# Patient Record
Sex: Female | Born: 1975 | Hispanic: Yes | Marital: Married | Smoking: Never smoker
Health system: Southern US, Community
[De-identification: ages and names within clinical notes are randomized; demographics above are authoritative.]

---

## 2007-09-18 LAB — CONVERTED CEMR LAB: Pap Smear: NORMAL

## 2007-10-14 ENCOUNTER — Ambulatory Visit (HOSPITAL_COMMUNITY): Admission: RE | Admit: 2007-10-14 | Discharge: 2007-10-14 | Payer: Self-pay | Admitting: Obstetrics and Gynecology

## 2008-04-29 ENCOUNTER — Ambulatory Visit: Payer: Self-pay | Admitting: *Deleted

## 2008-04-29 DIAGNOSIS — J45909 Unspecified asthma, uncomplicated: Secondary | ICD-10-CM | POA: Insufficient documentation

## 2008-04-29 DIAGNOSIS — L259 Unspecified contact dermatitis, unspecified cause: Secondary | ICD-10-CM

## 2008-04-29 DIAGNOSIS — Z9189 Other specified personal risk factors, not elsewhere classified: Secondary | ICD-10-CM | POA: Insufficient documentation

## 2010-08-03 ENCOUNTER — Inpatient Hospital Stay (HOSPITAL_COMMUNITY)
Admission: AD | Admit: 2010-08-03 | Discharge: 2010-08-03 | Disposition: A | Discharge status: Home / Self Care | Payer: BLUE CROSS/BLUE SHIELD | Source: Ambulatory Visit | Attending: Obstetrics and Gynecology | Admitting: Obstetrics and Gynecology

## 2010-08-03 DIAGNOSIS — O99891 Other specified diseases and conditions complicating pregnancy: Secondary | ICD-10-CM | POA: Insufficient documentation

## 2010-08-03 DIAGNOSIS — R32 Unspecified urinary incontinence: Secondary | ICD-10-CM | POA: Insufficient documentation

## 2010-08-03 DIAGNOSIS — O9989 Other specified diseases and conditions complicating pregnancy, childbirth and the puerperium: Secondary | ICD-10-CM

## 2010-08-17 ENCOUNTER — Encounter (HOSPITAL_COMMUNITY)
Admission: RE | Admit: 2010-08-17 | Discharge: 2010-08-17 | Disposition: A | Discharge status: Home / Self Care | Payer: BLUE CROSS/BLUE SHIELD | Source: Ambulatory Visit | Attending: Obstetrics and Gynecology | Admitting: Obstetrics and Gynecology

## 2010-08-17 LAB — BASIC METABOLIC PANEL
CO2: 23 mEq/L (ref 19–32)
Chloride: 102 mEq/L (ref 96–112)
Creatinine, Ser: 0.64 mg/dL (ref 0.4–1.2)
GFR calc Af Amer: >60 mL/min (ref >60)
Potassium: 3.6 mEq/L (ref 3.5–5.1)
Sodium: 132 mEq/L — ABNORMAL LOW (ref 135–145)

## 2010-08-17 LAB — CBC
Hemoglobin: 12.7 g/dL (ref 12.0–15.0)
MCH: 28.9 pg (ref 26.0–34.0)
RBC: 4.39 MIL/uL (ref 3.87–5.11)

## 2010-08-17 LAB — SURGICAL PCR SCREEN: MRSA, PCR: NEGATIVE

## 2010-08-17 LAB — RPR: RPR Ser Ql: NONREACTIVE

## 2010-08-24 ENCOUNTER — Inpatient Hospital Stay (HOSPITAL_COMMUNITY)
Admission: RE | Admit: 2010-08-24 | Discharge: 2010-08-27 | DRG: 371 | Disposition: A | Discharge status: Home / Self Care | Payer: BLUE CROSS/BLUE SHIELD | Source: Ambulatory Visit | Attending: Obstetrics and Gynecology | Admitting: Obstetrics and Gynecology

## 2010-08-24 ENCOUNTER — Other Ambulatory Visit: Payer: Self-pay | Admitting: Obstetrics and Gynecology

## 2010-08-24 DIAGNOSIS — Z01818 Encounter for other preprocedural examination: Secondary | ICD-10-CM

## 2010-08-24 DIAGNOSIS — O3090 Multiple gestation, unspecified, unspecified trimester: Secondary | ICD-10-CM | POA: Diagnosis present

## 2010-08-24 DIAGNOSIS — Z01812 Encounter for preprocedural laboratory examination: Secondary | ICD-10-CM

## 2010-08-24 DIAGNOSIS — O34219 Maternal care for unspecified type scar from previous cesarean delivery: Principal | ICD-10-CM | POA: Diagnosis present

## 2010-08-24 DIAGNOSIS — O30009 Twin pregnancy, unspecified number of placenta and unspecified number of amniotic sacs, unspecified trimester: Secondary | ICD-10-CM | POA: Diagnosis present

## 2010-08-24 DIAGNOSIS — O99814 Abnormal glucose complicating childbirth: Secondary | ICD-10-CM | POA: Diagnosis present

## 2010-08-24 LAB — GLUCOSE, CAPILLARY
Glucose-Capillary: 69 mg/dL — ABNORMAL LOW (ref 70–99)
Glucose-Capillary: 84 mg/dL (ref 70–99)

## 2010-08-25 LAB — RPR: RPR Ser Ql: NONREACTIVE

## 2010-08-25 LAB — CBC
Platelets: 137 10*3/uL — ABNORMAL LOW (ref 150–400)
RDW: 14.7 % (ref 11.5–15.5)
WBC: 9.3 10*3/uL (ref 4.0–10.5)

## 2010-09-12 NOTE — Discharge Summary (Signed)
  NAME:  Donna Lewis, Donna Lewis              ACCOUNT NO.:  [id number]  MEDICAL RECORD NO.:  [id number]           PATIENT TYPE:  I  LOCATION:  9147                          FACILITY:  [facility]  PHYSICIAN:  Arlyce Harman, MD     DATE OF BIRTH:  [birth year]  DATE OF ADMISSION:  08/24/2010 DATE OF DISCHARGE:  08/27/2010                              DISCHARGE SUMMARY   HOSPITAL COURSE:  The patient was admitted on Aug 24, 2010 and underwent a low transverse cervical cesarean section and bilateral tubal ligation. Postoperative course has been uncomplicated and the patient is being discharged on the third postop day in good condition.  She will be followed up in my office in 2 weeks to check her incision.  The patient was admitted and had a C-section for twins.  PERTINENT LABORATORY DATA:  Hemoglobin on May 5 was 10.6.  FINAL DIAGNOSIS:  Status post C section for twins and elective sterilization.  SIGNIFICANT FINDINGS:  None.  PROCEDURES PERFORMED:  Secondary low transverse cervical cesarean section and Pomeroy tubal ligation.  CONDITION AT DISCHARGE:  Excellent condition.  Vital signs are stable.  The patient is ambulatory.  Instructions given to the patient.  Follow up in the office in 2 weeks.  No heavy lifting, home confinement for the next 2 weeks, then gradually increase activities, no coitus for 6 weeks.  Do not lift over 10 pounds for the first 2-3 weeks.  MEDICATIONS: 1. Percocet 5/325.  She is to take 1-2 every 4-6 hours as needed for     pain. 2. Ferrous sulfate 325 mg b.i.d. 3. Laxatives and stool softeners if needed.     Arlyce Harman, MD     EG/MEDQ  D:  08/27/2010  T:  08/28/2010  Job:  [id number]  Electronically Signed by Arlyce Harman MD on 09/12/2010 06:29:31 PM

## 2010-10-09 ENCOUNTER — Other Ambulatory Visit (HOSPITAL_COMMUNITY)
Admission: RE | Admit: 2010-10-09 | Discharge: 2010-10-09 | Disposition: A | Discharge status: Home / Self Care | Payer: BC Managed Care – PPO | Source: Ambulatory Visit | Attending: Obstetrics and Gynecology | Admitting: Obstetrics and Gynecology

## 2010-10-09 ENCOUNTER — Other Ambulatory Visit: Payer: Self-pay | Admitting: Obstetrics and Gynecology

## 2010-10-09 DIAGNOSIS — Z01419 Encounter for gynecological examination (general) (routine) without abnormal findings: Secondary | ICD-10-CM | POA: Insufficient documentation

## 2010-10-15 NOTE — Op Note (Signed)
NAME:  Donna Lewis, Donna Lewis              ACCOUNT NO.:  [id number]  MEDICAL RECORD NO.:  [id number]           PATIENT TYPE:  I  LOCATION:  9147                          [facility]ILITY:  [facility]  PHYSICIAN:  Arlyce Harman, MD     DATE OF BIRTH:  [birth year]  DATE OF PROCEDURE:  08/24/2010 DATE OF DISCHARGE:  Procedure:  Secondary low transverse Cesarean sectoin, pomeroy tubal ligation                              OPERATIVE REPORT   PREOPERATIVE DIAGNOSES: 1. A 38+ weeks twins. 2. Previous cesarean section for repeat cesarean section elective     sterilization. 3. Gestational diabetes, diet-controlled.  POSTOPERATIVE DIAGNOSES: 1. A 38+ weeks twins. 2. Previous cesarean section for repeat cesarean section elective     sterilization. 3. Gestational diabetes, diet-controlled.  SURGEON:  Arlyce Harman, MD  ASSISTANT:  Pieter Partridge, MD  INDICATIONS AND CONSENT:  The patient is a 35 year old gravida 2, para 1 who is 38+ weeks with twins and had a previous cesarean section for repeat C-section, twins are breech, transverse.  The patient has gestational diabetes and is diet control and the patient has the crusted elective sterilization.  Risks and possible complications have been discussed and consent has been given.  PROCEDURE:  The patient was placed on the table in a supine position with wedging.  The abdomen and vagina were sterilely prepped and draped in the usual fashion and a Pfannenstiel incision was made along the margins of the previous incision and the scar was excised and handed off.  The incision was extended through the abdominal layers and the peritoneum was entered.  The uterus was entered in a low transverse cervical fashion and the membranes presented, they were brought forth and ruptured.  The fluid was clear.  Baby A was delivered by grasping the 2 feet which presented and then delivered the lower extremities followed by the lower torso and upper extremities and then the  head was delivered using a Mauriceau technique and the cord was clamped and cut and the nose was suctioned out and one clamp was placed on the cord of baby A, which was female.  Her weight was later found to be 6 pounds and 4 ounces, Apgars 9 at 9, then the second bag presented and the membranes were clear.  The membranes were ruptured and the shoulder and upper extremity presented, which was the replaced and then was manipulated around, so we could grasp the lower extremities and then they were delivered followed by lower and upper torso and upper extremities and the head was delivered using a Mauriceau technique and the cord was doubly clamped and cut and the mouth was suctioned out, and this infant was a female with Apgars of 8 and 9, weight was 6 pounds and 15 ounces. The placenta was then spontaneously delivered and this was handed off and the uterus was sponged out.  The uterus was closed with 0 Vicryl in a running locked fashion and the second layer being that of a modified Lembert stitch.  The peritoneal portion was closed with 2-0 chromic suture in a running fashion.  The abdomen was then irrigated and suctioned out.  Hemostasis was good.  At this point, the peritoneum was closed using 2-0 chromic in a running fashion and the muscle was approximated using 2-0 chromic to the midline, two interrupted sutures were placed.  The field was then irrigated and suctioned again and the fascia was closed using 0 Vicryl in a running locked fashion.  The subcu was then closed using 2-0 plain in a running fashion and the skin was approximated with 4-0 Vicryl using a Keith needle and closed with interrupted subcuticular fashion.  Pressure dressing was then placed on the incision and the patient was not really awake.  She was taken to recovery in good condition.  SPECIMENS:  Bilateral tubal segments and previous scar which was removed and the placenta.  DISPOSITION:  Sent to  pathology.  ESTIMATED BLOOD LOSS:  800 mL.     Arlyce Harman, MD     EG/MEDQ  D:  08/24/2010  T:  08/25/2010  Job:  [id number]  Electronically Signed by Arlyce Harman MD on 10/15/2010 10:20:00 AM

## 2012-02-03 ENCOUNTER — Other Ambulatory Visit (HOSPITAL_COMMUNITY)
Admission: RE | Admit: 2012-02-03 | Discharge: 2012-02-03 | Disposition: A | Discharge status: Home / Self Care | Payer: BC Managed Care – PPO | Source: Ambulatory Visit | Attending: Obstetrics and Gynecology | Admitting: Obstetrics and Gynecology

## 2012-02-03 ENCOUNTER — Other Ambulatory Visit: Payer: Self-pay | Admitting: Obstetrics and Gynecology

## 2012-02-03 DIAGNOSIS — Z01419 Encounter for gynecological examination (general) (routine) without abnormal findings: Secondary | ICD-10-CM | POA: Insufficient documentation

## 2012-02-03 DIAGNOSIS — Z1151 Encounter for screening for human papillomavirus (HPV): Secondary | ICD-10-CM | POA: Insufficient documentation

## 2021-05-24 ENCOUNTER — Encounter: Payer: Self-pay | Admitting: Family Medicine

## 2021-05-24 ENCOUNTER — Telehealth: Payer: Self-pay | Admitting: Family Medicine

## 2021-05-24 ENCOUNTER — Ambulatory Visit: Payer: 59 | Admitting: Family Medicine

## 2021-05-24 VITALS — BP 123/93 | HR 97 | Ht 65.0 in | Wt 170.6 lb

## 2021-05-24 DIAGNOSIS — Z7689 Persons encountering health services in other specified circumstances: Secondary | ICD-10-CM

## 2021-05-24 DIAGNOSIS — Z0000 Encounter for general adult medical examination without abnormal findings: Secondary | ICD-10-CM

## 2021-05-24 DIAGNOSIS — Z803 Family history of malignant neoplasm of breast: Secondary | ICD-10-CM | POA: Diagnosis not present

## 2021-05-24 DIAGNOSIS — Z1231 Encounter for screening mammogram for malignant neoplasm of breast: Secondary | ICD-10-CM | POA: Diagnosis not present

## 2021-05-24 LAB — CBC
HCT: 42 % (ref 36.0–46.0)
Hemoglobin: 13.8 g/dL (ref 12.0–15.0)
MCHC: 32.8 g/dL (ref 30.0–36.0)
MCV: 89.8 fl (ref 78.0–100.0)
Platelets: 416 10*3/uL — ABNORMAL HIGH (ref 150.0–400.0)
RBC: 4.67 Mil/uL (ref 3.87–5.11)
RDW: 12.4 % (ref 11.5–15.5)
WBC: 6.3 10*3/uL (ref 4.0–10.5)

## 2021-05-24 LAB — COMPREHENSIVE METABOLIC PANEL
ALT: 21 U/L (ref 0–35)
AST: 17 U/L (ref 0–37)
Albumin: 4.4 g/dL (ref 3.5–5.2)
Alkaline Phosphatase: 81 U/L (ref 39–117)
BUN: 10 mg/dL (ref 6–23)
CO2: 32 mEq/L (ref 19–32)
Calcium: 9.3 mg/dL (ref 8.4–10.5)
Chloride: 100 mEq/L (ref 96–112)
Creatinine, Ser: 0.68 mg/dL (ref 0.40–1.20)
GFR: 105.27 mL/min (ref >60.00)
Glucose, Bld: 97 mg/dL (ref 70–99)
Potassium: 3.9 mEq/L (ref 3.5–5.1)
Sodium: 138 mEq/L (ref 135–145)
Total Bilirubin: 0.4 mg/dL (ref 0.2–1.2)
Total Protein: 7.7 g/dL (ref 6.0–8.3)

## 2021-05-24 LAB — LIPID PANEL
Cholesterol: 144 mg/dL (ref 0–200)
HDL: 38.9 mg/dL — ABNORMAL LOW (ref >39.00)
NonHDL: 104.93
Total CHOL/HDL Ratio: 4
Triglycerides: 269 mg/dL — ABNORMAL HIGH (ref 0.0–149.0)
VLDL: 53.8 mg/dL — ABNORMAL HIGH (ref 0.0–40.0)

## 2021-05-24 LAB — LDL CHOLESTEROL, DIRECT: Direct LDL: 58 mg/dL

## 2021-05-24 LAB — TSH: TSH: 3.38 u[IU]/mL (ref 0.35–5.50)

## 2021-05-24 NOTE — Telephone Encounter (Signed)
Pt wanted taylor to know her previous gyno was Dr. Neva Seat. She will be trying to upload medical records. Please advise  [facility] [address] [place] [address]

## 2021-05-24 NOTE — Progress Notes (Signed)
BP (!) 123/93    Pulse 97    Ht 5\' 5"  (1.651 m)    Wt 170 lb 9.6 oz (77.4 kg)    BMI 28.39 kg/m    Subjective:    Patient ID: Donna Lewis, female    DOB: 24-Oct-1975, 46 y.o.   MRN: [id number]  HPI: Donna Lewis is a 46 y.o. female presenting on 05/24/2021 for comprehensive medical examination. Current medical complaints include: none  She currently lives with: husband and children (3) Interim Problems from her last visit: no   She reports regular vision exams q1-5y: yes She reports regular dental exams q 7515m: yes Her diet consists of: well balanced  She endorses exercise and/or activity of: walking daily  She works at: sign language interpreter   She denies ETOH use. She denies nictoine use. She denies illegal substance use.    She reports regular menstrual periods with heavy flow. Current menopausal symptoms: no She is currently  sexually active  She denies  concerns today about STI Contraception choices are: tubal ligation   She denies concerns about skin changes today. She denies concerns about bowel changes today. She denies concerns about bladder changes today.   Depression Screen done today and results listed below:  [facility]  Decreased Interest 0  Down, Depressed, Hopeless 0  PHQ - 2 Score 0     She does not have a history of falls.     Past Medical History:  History reviewed. No pertinent past medical history.  Surgical History:  Past Surgical History:  Procedure Laterality Date   CESAREAN SECTION      Medications:  No current outpatient medications on file prior to visit.   No current facility-administered medications on file prior to visit.    Allergies:  No Known Allergies  Social History:  Social History   Socioeconomic History   Marital status: Married    Spouse name: Not on file   Number of children: Not on file   Years of education: Not on file   Highest education level: Not on file  Occupational  History   Not on file  Tobacco Use   Smoking status: Never   Smokeless tobacco: Never  Substance and Sexual Activity   Alcohol use: Not Currently   Drug use: Never   Sexual activity: Yes  Other Topics Concern   Not on file  Social History Narrative   Not on file   Social Determinants of Health   Financial Resource Strain: Not on file  Food Insecurity: Not on file  Transportation Needs: Not on file  Physical Activity: Not on file  Stress: Not on file  Social Connections: Not on file  Intimate Partner Violence: Not on file   Social History   Tobacco Use  Smoking Status Never  Smokeless Tobacco Never   Social History   Substance and Sexual Activity  Alcohol Use Not Currently    Family History:  Family History  Problem Relation Age of Onset   Hypertension Father    Hyperlipidemia Father    Thyroid disease Father     Past medical history, surgical history, medications, allergies, family history and social history reviewed with patient today and changes made to appropriate areas of the chart.   All ROS negative except what is listed above and in the HPI.      Objective:    BP (!) 123/93    Pulse 97    Ht 5\' 5"  (1.651 m)  Wt 170 lb 9.6 oz (77.4 kg)    BMI 28.39 kg/m   Wt Readings from Last 3 Encounters:  05/24/21 170 lb 9.6 oz (77.4 kg)    Physical Exam Vitals and nursing note reviewed.  Constitutional:      General: She is not in acute distress.    Appearance: Normal appearance. She is normal weight.  HENT:     Head: Normocephalic and atraumatic.     Right Ear: Hearing, tympanic membrane, ear canal and external ear normal.     Left Ear: Hearing, tympanic membrane, ear canal and external ear normal.     Nose: Nose normal. No mucosal edema, congestion or rhinorrhea.     Right Sinus: No maxillary sinus tenderness or frontal sinus tenderness.     Left Sinus: No maxillary sinus tenderness or frontal sinus tenderness.     Mouth/Throat:     Lips: Pink.      Mouth: Mucous membranes are moist.     Tongue: No lesions.     Pharynx: Oropharynx is clear. Uvula midline. No oropharyngeal exudate or posterior oropharyngeal erythema.     Tonsils: No tonsillar exudate or tonsillar abscesses.  Eyes:     General: Lids are normal. Vision grossly intact.     Extraocular Movements: Extraocular movements intact.     Conjunctiva/sclera: Conjunctivae normal.     Pupils: Pupils are equal, round, and reactive to light.     Funduscopic exam:    Right eye: Red reflex present.        Left eye: Red reflex present.    Visual Fields: Right eye visual fields normal and left eye visual fields normal.  Neck:     Thyroid: No thyroid mass, thyromegaly or thyroid tenderness.     Vascular: No carotid bruit or JVD.     Trachea: Trachea normal.  Cardiovascular:     Rate and Rhythm: Normal rate and regular rhythm.     Chest Wall: PMI is not displaced.     Pulses: Normal pulses.     Heart sounds: Normal heart sounds. No murmur heard. Pulmonary:     Effort: Pulmonary effort is normal. No respiratory distress.     Breath sounds: Normal breath sounds.  Chest:     Chest wall: No deformity.  Breasts:    Breasts are symmetrical.  Abdominal:     General: Abdomen is flat. Bowel sounds are normal. There is no distension.     Palpations: Abdomen is soft. There is no hepatomegaly, splenomegaly or mass.     Tenderness: There is no abdominal tenderness. There is no right CVA tenderness, left CVA tenderness, guarding or rebound.  Genitourinary:    Vagina: Normal. No tenderness or lesions.     Cervix: No cervical motion tenderness, discharge, friability or lesion.     Uterus: Normal. Not tender.      Adnexa: Right adnexa normal and left adnexa normal.       Right: No mass or tenderness.         Left: No mass or tenderness.    Musculoskeletal:        General: No swelling, tenderness, deformity or signs of injury. Normal range of motion.     Right shoulder: Normal.     Left  shoulder: Normal.     Right upper arm: Normal.     Left upper arm: Normal.     Cervical back: Normal, full passive range of motion without pain, normal range of motion and neck supple. No tenderness.  Thoracic back: Normal.     Lumbar back: Normal.     Right hip: Normal.     Left hip: Normal.     Right upper leg: Normal.     Left upper leg: Normal.     Right knee: Normal.     Left knee: Normal.     Right lower leg: No edema.     Left lower leg: No edema.  Feet:     Right foot:     Skin integrity: Skin integrity normal.     Toenail Condition: Right toenails are normal.     Left foot:     Skin integrity: Skin integrity normal.     Toenail Condition: Left toenails are normal.  Lymphadenopathy:     Head:     Right side of head: No submental or tonsillar adenopathy.     Left side of head: No submental or tonsillar adenopathy.     Cervical: No cervical adenopathy.     Right cervical: No superficial cervical adenopathy.    Left cervical: No superficial cervical adenopathy.     Upper Body:     Right upper body: No supraclavicular adenopathy.     Left upper body: No supraclavicular adenopathy.  Skin:    General: Skin is warm and dry.     Capillary Refill: Capillary refill takes less than 2 seconds.     Findings: No bruising or rash.     Nails: There is no clubbing.  Neurological:     General: No focal deficit present.     Mental Status: She is alert and oriented to person, place, and time.     GCS: GCS eye subscore is 4. GCS verbal subscore is 5. GCS motor subscore is 6.     Sensory: Sensation is intact.     Motor: Motor function is intact.     Coordination: Coordination is intact.     Gait: Gait is intact.     Deep Tendon Reflexes: Reflexes are normal and symmetric.  Psychiatric:        Attention and Perception: Attention normal.        Mood and Affect: Mood and affect normal.        Speech: Speech normal.        Behavior: Behavior normal. Behavior is cooperative.         Thought Content: Thought content normal.        Cognition and Memory: Cognition and memory normal.        Judgment: Judgment normal.    Results for orders placed or performed during the hospital encounter of 08/24/10  Glucose, capillary  Result Value Ref Range   Glucose-Capillary 84 70 - 99 mg/dL  Glucose, capillary  Result Value Ref Range   Glucose-Capillary 69 (L) 70 - 99 mg/dL  CBC  Result Value Ref Range   WBC 9.3 4.0 - 10.5 K/uL   RBC 3.74 (L) 3.87 - 5.11 MIL/uL   Hemoglobin 10.6 (L) 12.0 - 15.0 g/dL   HCT 62.8 (L) 36.6 - 29.4 %   MCV 88.5 78.0 - 100.0 fL   MCH 28.3 26.0 - 34.0 pg   MCHC 32.0 30.0 - 36.0 g/dL   RDW 76.5 46.5 - 03.5 %   Platelets 137 (L) 150 - 400 K/uL  RPR  Result Value Ref Range   RPR Ser Ql NON REACTIVE NON REACTIVE      Assessment & Plan:   Problem List Items Addressed This Visit   None Visit Diagnoses  Family history of breast cancer    -  Primary   Relevant Orders   MM DIGITAL SCREENING BILATERAL   Encounter for screening mammogram for malignant neoplasm of breast       Relevant Orders   MM DIGITAL SCREENING BILATERAL   Encounter to establish care       Annual physical exam       Relevant Orders   CBC   Comprehensive metabolic panel   Lipid panel   TSH          LABORATORY TESTING:  - Health maintenance labs ordered today as discussed above.           CBC, CMP, LIPIDS          TSH  - STI testing: deferred - Pap smear: declined - HIV screening: done elsewhere - requesting records  - Hep C screening: done elsewhere - requesting records    IMMUNIZATIONS:   - Tdap: Tetanus vaccination status reviewed: declined. - Influenza: Refused - Pneumovax: Not applicable - Prevnar: Not applicable - HPV: Not applicable - Shingrix vaccine: Not applicable - [id number]: Refused  SCREENING: - Mammogram: Ordered today - Bone Density: Not applicable - Colonoscopy: Not applicable  - AAA Screening: Not applicable  -Hearing Test: Not  applicable  -Spirometry: Not applicable  - Lung Cancer Screening: Not applicable    PATIENT COUNSELING:   Advised to take 1 mg of folate supplement per day if capable of pregnancy.   Sexuality: Discussed sexually transmitted diseases, partner selection, use of condoms, avoidance of unintended pregnancy, and contraceptive alternatives.    I discussed with the patient that most people either abstain from alcohol or drink within safe limits (<=14/week and <=4 drinks/occasion for males, <=7/weeks and <= 3 drinks/occasion for females) and that the risk for alcohol disorders and other health effects rises proportionally with the number of drinks per week and how often a drinker exceeds daily limits.  Discussed cessation/primary prevention of drug use and availability of treatment for abuse.   Diet: Encouraged to adjust caloric intake to maintain or achieve ideal body weight, to reduce intake of dietary saturated fat and total fat, to limit sodium intake by avoiding high sodium foods and not adding table salt, and to maintain adequate dietary potassium and calcium preferably from fresh fruits, vegetables, and low-fat dairy products. Encouraged vitamin D 1000 units and Calcium 1300mg  or 4 servings of dairy a day.  Emphasized the importance of regular exercise.  Injury prevention: Discussed safety belts, safety helmets, smoke detector, smoking near bedding or upholstery.   Dental health: Discussed importance of regular tooth brushing, flossing, and dental visits.  Follow up plan:  Return in about 1 year (around 05/24/2022) for physical, needs pap.   Purcell Nails Olevia Bowens, DNP, FNP-C

## 2021-05-24 NOTE — Patient Instructions (Signed)
Thank you for choosing [facility] at [facility] for your Primary Care needs. I am excited for the opportunity to partner with you to meet your health care goals. It was a pleasure meeting you today! ° ° °Information on diet, exercise, and health maintenance recommendations are listed below. This is information to help you be sure you are on track for optimal health and monitoring.  ° °Please look over this and let us know if you have any questions or if you have completed any of the health maintenance outside of [facility] so that we can be sure your records are up to date.  °___________________________________________________________ ° °MyChart:  °For all urgent or time sensitive needs we ask that you please call the office to avoid delays. Our number is [phone]. °MyChart is not constantly monitored and due to the large volume of messages a day, replies may take up to 72 business hours. ° °MyChart Policy: °MyChart allows for you to see your visit notes, after visit summary, provider recommendations, lab and tests results, make an appointment, request refills, and contact your provider or the office for non-urgent questions or concerns. Providers are seeing patients during normal business hours and do not have built in time to review MyChart messages.  °We ask that you allow a minimum of 3 business days for responses to MyChart messages. For this reason, please do not send urgent requests through MyChart. Please call the office at [phone]. °New and ongoing conditions may require a visit. We have virtual and in-person visits available for your convenience.  °Complex MyChart concerns may require a visit. Your provider may request you schedule a virtual or in-person visit to ensure we are providing the best care possible. °MyChart messages sent after 11:00 AM on Friday will not be received by the provider until Monday morning.  °  °Lab and Test Results: °You will receive your lab and  test results on MyChart as soon as they are completed and results have been sent by the lab or testing facility. Due to this service, you will receive your results BEFORE your provider.  °I review lab and test results each morning prior to seeing patients. Some results require collaboration with other providers to ensure you are receiving the most appropriate care. For this reason, we ask that you please allow a minimum of 3-5 business days from the time that ALL results have been received for your provider to receive and review lab and test results and contact you about these.  °Most lab and test result comments from the provider will be sent through MyChart. Your provider may recommend changes to the plan of care, follow-up visits, repeat testing, ask questions, or request an office visit to discuss these results. You may reply directly to this message or call the office to provide information for the provider or set up an appointment. °In some instances, you will be called with test results and recommendations. Please let us know if this is preferred and we will make note of this in your chart to provide this for you.    °If you have not heard a response to your lab or test results in 5 business days from all results returning to MyChart, please call the office to let us know. We ask that you please avoid calling prior to this time unless there is an emergent concern. Due to high call volumes, this can delay the resulting process. ° °After Hours: °For all non-emergency after hours needs,   please call the office at [phone] and select the option to reach the on-call  service. On-call services are shared between multiple [facility] offices and therefore it will not be possible to speak directly with your provider. On-call providers may provide medical advice and recommendations, but are unable to provide refills for maintenance medications.  °For all emergency or urgent medical needs after normal business  hours, we recommend that you seek care at the closest Urgent Care or Emergency Department to ensure appropriate treatment in a timely manner.  °[facility] has a 24 hour emergency room located on the ground floor for your convenience.  ° °Urgent Concerns During the Business Day °Providers are seeing patients from 8AM to 5PM with a busy schedule and are most often not able to respond to non-urgent calls until the end of the day or the next business day. °If you should have URGENT concerns during the day, please call and speak to the nurse or schedule a same day appointment so that we can address your concern without delay.  ° °Thank you, again, for choosing me as your health care partner. I appreciate your trust and look forward to learning more about you.  ° °Krystina Strieter B. Qamar Aughenbaugh, DNP, FNP-C ° °___________________________________________________________ ° °Health Maintenance Recommendations °Screening Testing °Mammogram °Every 1-2 years based on history and risk factors °Starting at age 50 °Pap Smear °Ages 21-39 every 3 years °Ages 30-65 every 5 years with HPV testing °More frequent testing may be required based on results and history °Colon Cancer Screening °Every 1-10 years based on test performed, risk factors, and history °Starting at age 45 °Bone Density Screening °Every 2-10 years based on history °Starting at age 65 for women °Recommendations for men differ based on medication usage, history, and risk factors °AAA Screening °One time ultrasound °Men 65-75 years old who have ever smoked °Lung Cancer Screening °Low Dose Lung CT every 12 months °Age 50-80 years with a 20 pack-year smoking history who still smoke or who have quit within the last 15 years ° °Screening Labs °Routine  Labs: Complete Blood Count (CBC), Complete Metabolic Panel (CMP), Cholesterol (Lipid Panel) °Every 6-12 months based on history and medications °May be recommended more frequently based on current conditions or  previous results °Hemoglobin A1c Lab °Every 3-12 months based on history and previous results °Starting at age 45 or earlier with diagnosis of diabetes, high cholesterol, BMI >26, and/or risk factors °Frequent monitoring for patients with diabetes to ensure blood sugar control °Thyroid Panel (TSH w/ T3 & T4) °Every 6 months based on history, symptoms, and risk factors °May be repeated more often if on medication °HIV °One time testing for all patients 13 and older °May be repeated more frequently for patients with increased risk factors or exposure °Hepatitis C °One time testing for all patients 18 and older °May be repeated more frequently for patients with increased risk factors or exposure °Gonorrhea, Chlamydia °Every 12 months for all sexually active persons 13-24 years °Additional monitoring may be recommended for those who are considered high risk or who have symptoms °PSA °Men 40-54 years old with risk factors °Additional screening may be recommended from age 55-69 based on risk factors, symptoms, and history ° °Vaccine Recommendations °Tetanus Booster °All adults every 10 years °Flu Vaccine °All patients 6 months and older every year °COVID Vaccine °All patients 12 years and older °Initial dosing with booster °May recommend additional booster based on age and health history °HPV Vaccine °2 doses all patients   age 9-26 °Dosing may be considered for patients over 26 °Shingles Vaccine (Shingrix) °2 doses all adults 50 years and older °Pneumonia (Pneumovax 23) °All adults 65 years and older °May recommend earlier dosing based on health history °Pneumonia (Prevnar 13) °All adults 65 years and older °Dosed 1 year after Pneumovax 23 °Pneumonia (Prevnar 20) °All adults 65 years and older (adults 19-64 with certain conditions or risk factors) °1 dose  °For those who have no received Prevnar 13 vaccine previously ° ° °Additional Screening, Testing, and Vaccinations may be recommended on an individualized basis based on  family history, health history, risk factors, and/or exposure.  °__________________________________________________________ ° °Diet Recommendations for All Patients ° °I recommend that all patients maintain a diet low in saturated fats, carbohydrates, and cholesterol. While this can be challenging at first, it is not impossible and small changes can make big differences.  °Things to try: °Decreasing the amount of soda, sweet tea, and/or juice to one or less per day and replace with water °While water is always the first choice, if you do not like water you may consider °adding a water additive without sugar to improve the taste °other sugar free drinks °Replace potatoes with a brightly colored vegetable at dinner °Use healthy oils, such as canola oil or olive oil, instead of butter or hard margarine °Limit your bread intake to two pieces or less a day °Replace regular pasta with low carb pasta options °Bake, broil, or grill foods instead of frying °Monitor portion sizes  °Eat smaller, more frequent meals throughout the day instead of large meals ° °An important thing to remember is, if you love foods that are not great for your health, you don't have to give them up completely. Instead, allow these foods to be a reward when you have done well. Allowing yourself to still have special treats every once in a while is a nice way to tell yourself thank you for working hard to keep yourself healthy.  ° °Also remember that every day is a new day. If you have a bad day and "fall off the wagon", you can still climb right back up and keep moving along on your journey! ° °We have resources available to help you!  °Some websites that may be helpful include: °www.MyPlate.gov  °Www.VeryWellFit.com °_____________________________________________________________ ° °Activity Recommendations for All Patients ° °I recommend that all adults get at least 20 minutes of moderate physical activity that elevates your heart rate at least 5  days out of the week.  °Some examples include: °Walking or jogging at a pace that allows you to carry on a conversation °Cycling (stationary bike or outdoors) °Water aerobics °Yoga °Weight lifting °Dancing °If physical limitations prevent you from putting stress on your joints, exercise in a pool or seated in a chair are excellent options. ° °Do determine your MAXIMUM heart rate for activity: YOUR AGE - 220 = MAX HeartRate  ° °Remember! °Do not push yourself too hard.  °Start slowly and build up your pace, speed, weight, time in exercise, etc.  °Allow your body to rest between exercise and get good sleep. °You will need more water than normal when you are exerting yourself. Do not wait until you are thirsty to drink. Drink with a purpose of getting in at least 8, 8 ounce glasses of water a day plus more depending on how much you exercise and sweat.  ° ° °If you begin to develop dizziness, chest pain, abdominal pain, jaw pain, shortness of breath, headache, vision   changes, lightheadedness, or other concerning symptoms, stop the activity and allow your body to rest. If your symptoms are severe, seek emergency evaluation immediately. If your symptoms are concerning, but not severe, please let us know so that we can recommend further evaluation.  ° ° ° °

## 2021-05-25 NOTE — Telephone Encounter (Signed)
Pt will be adding records.   I was going to attempt to fax over a record release request but I cant even find a phone number to get a fax number for this office.

## 2021-05-25 NOTE — Telephone Encounter (Signed)
Noted  

## 2021-06-28 ENCOUNTER — Encounter: Payer: Self-pay | Admitting: Family Medicine

## 2021-06-28 DIAGNOSIS — Z1231 Encounter for screening mammogram for malignant neoplasm of breast: Secondary | ICD-10-CM

## 2021-07-19 ENCOUNTER — Ambulatory Visit (INDEPENDENT_AMBULATORY_CARE_PROVIDER_SITE_OTHER): Payer: 59

## 2021-07-19 DIAGNOSIS — Z1231 Encounter for screening mammogram for malignant neoplasm of breast: Secondary | ICD-10-CM | POA: Diagnosis not present

## 2021-07-20 ENCOUNTER — Other Ambulatory Visit: Payer: Self-pay | Admitting: Family Medicine

## 2021-07-20 DIAGNOSIS — R928 Other abnormal and inconclusive findings on diagnostic imaging of breast: Secondary | ICD-10-CM

## 2021-07-20 NOTE — Telephone Encounter (Signed)
Pt stated someone reached out to her today regarding imaging however they were not inn.  ? ?Pt would like to be assigned to location and call back with contact information.  ? ?Please advise.  ?

## 2021-07-24 ENCOUNTER — Other Ambulatory Visit: Payer: Self-pay | Admitting: *Deleted

## 2021-07-24 ENCOUNTER — Telehealth: Payer: Self-pay | Admitting: Family Medicine

## 2021-07-24 DIAGNOSIS — N6310 Unspecified lump in the right breast, unspecified quadrant: Secondary | ICD-10-CM

## 2021-07-24 DIAGNOSIS — R928 Other abnormal and inconclusive findings on diagnostic imaging of breast: Secondary | ICD-10-CM

## 2021-07-24 DIAGNOSIS — Z803 Family history of malignant neoplasm of breast: Secondary | ICD-10-CM

## 2021-07-24 DIAGNOSIS — N6489 Other specified disorders of breast: Secondary | ICD-10-CM

## 2021-07-24 NOTE — Telephone Encounter (Signed)
Orders placed and faxed

## 2021-07-24 NOTE — Telephone Encounter (Signed)
Pt would like diagnostic mammogram and ultrasound order put in. She would like them sent to [facility]. Orders can be faxed to [phone].  ?

## 2021-08-09 ENCOUNTER — Encounter: Payer: Self-pay | Admitting: *Deleted

## 2021-08-09 LAB — HM MAMMOGRAPHY

## 2022-07-03 IMAGING — MG MM DIGITAL SCREENING BILAT W/ TOMO AND CAD
8 series · 8 of 24 positions shown · non-contrast
Comparison: None.

CLINICAL DATA: Screening.

EXAM:
DIGITAL SCREENING BILATERAL MAMMOGRAM WITH TOMOSYNTHESIS AND CAD
TECHNIQUE: Bilateral screening digital craniocaudal and mediolateral oblique
mammograms were obtained. Bilateral screening digital breast
tomosynthesis was performed. The images were evaluated with
computer-aided detection.

[L CC synth-2D]
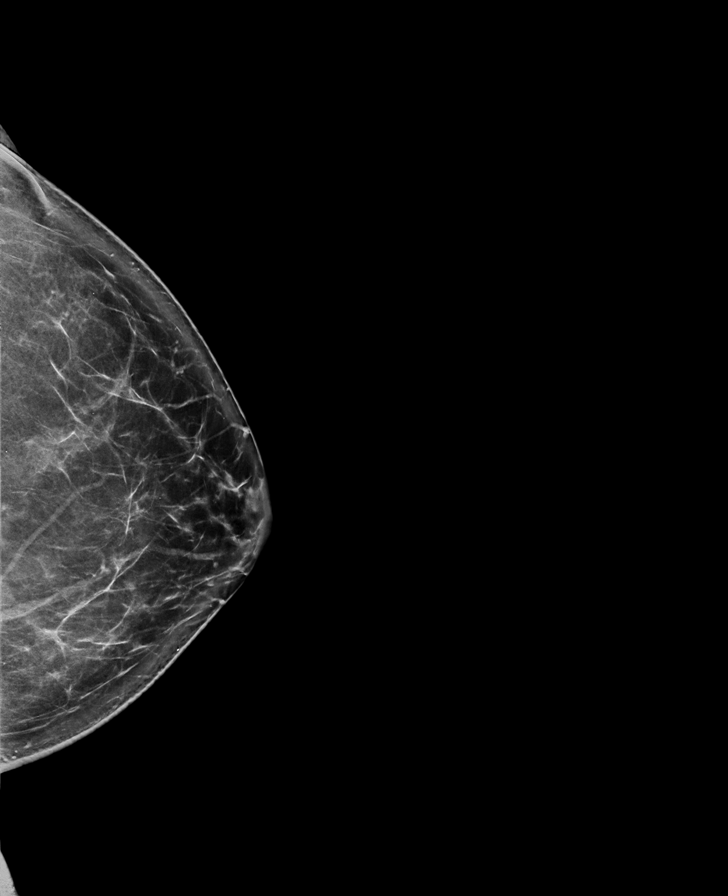

[R CC synth-2D]
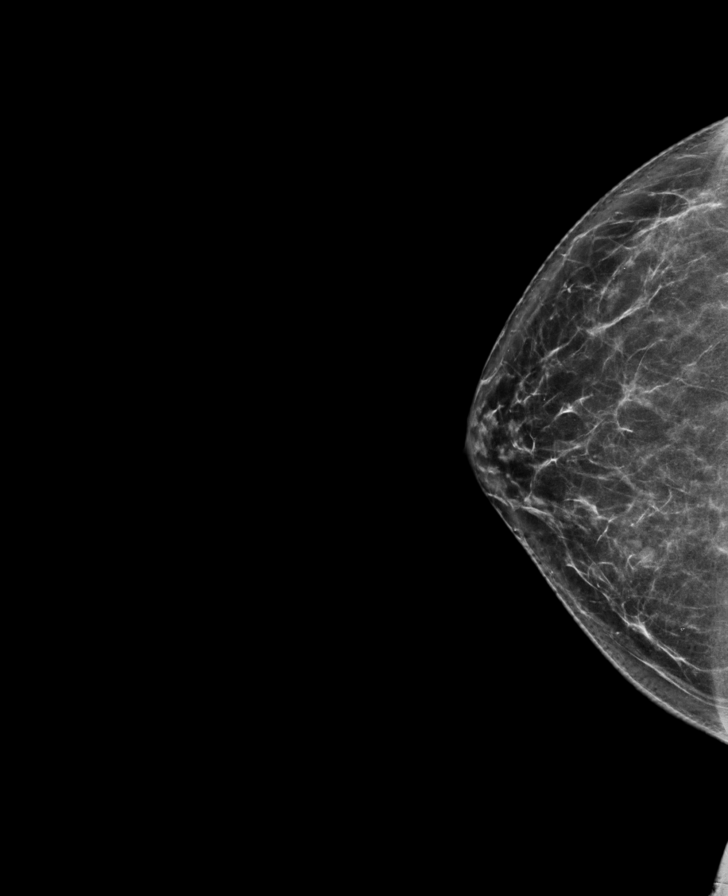

[R MLO synth-2D]
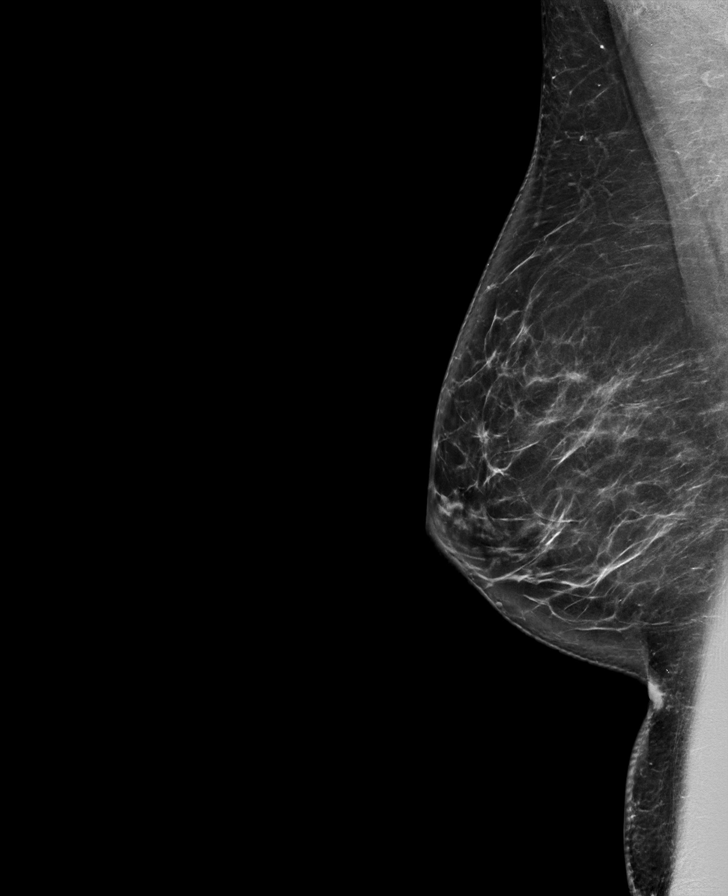

[L MLO synth-2D]
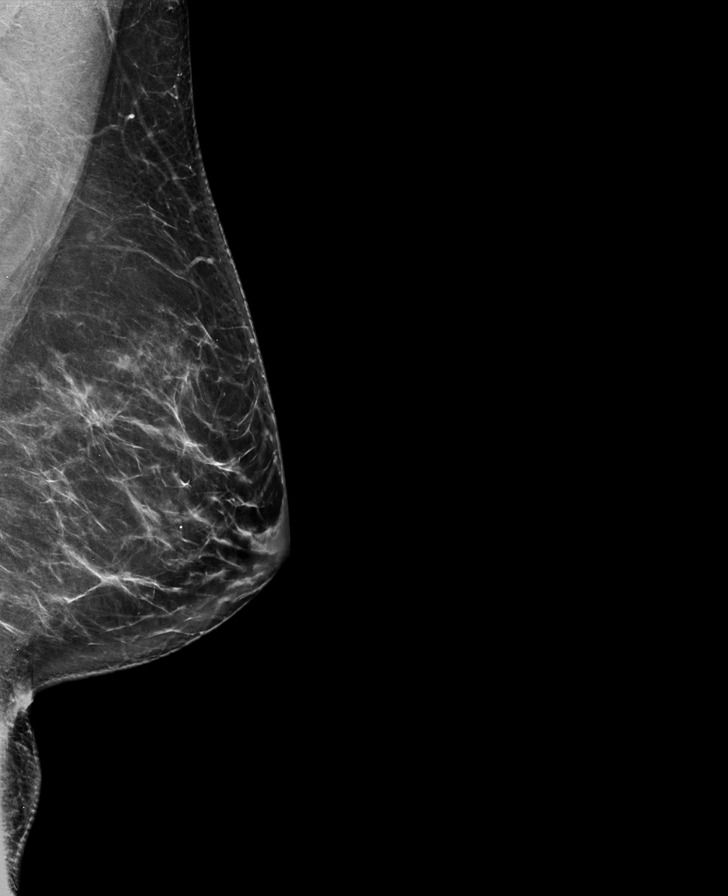

[R CC tomo · tomo slice 37/73.0]
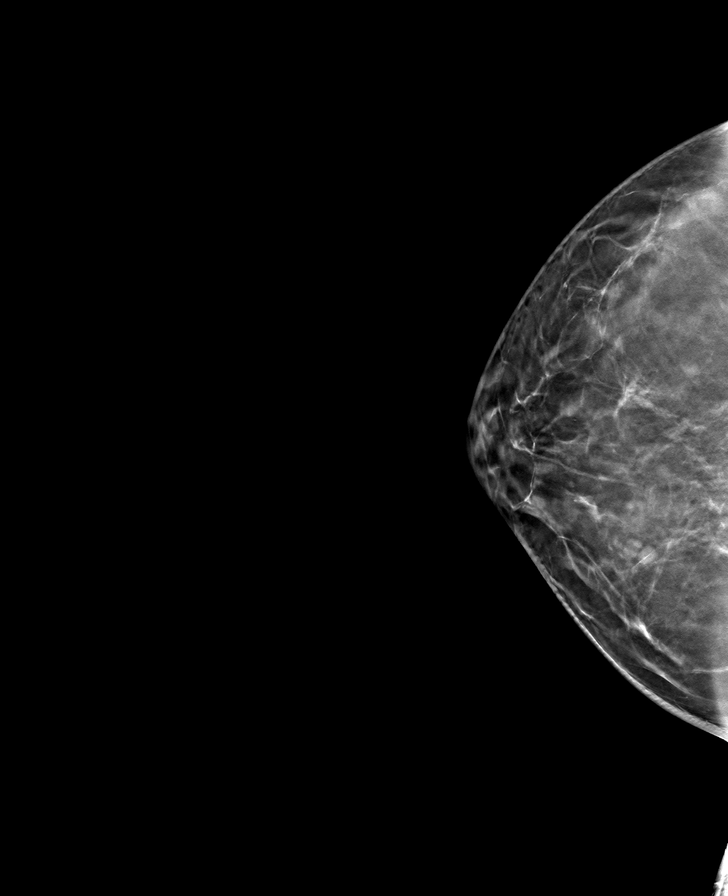

[R MLO tomo · tomo slice 42/83.0]
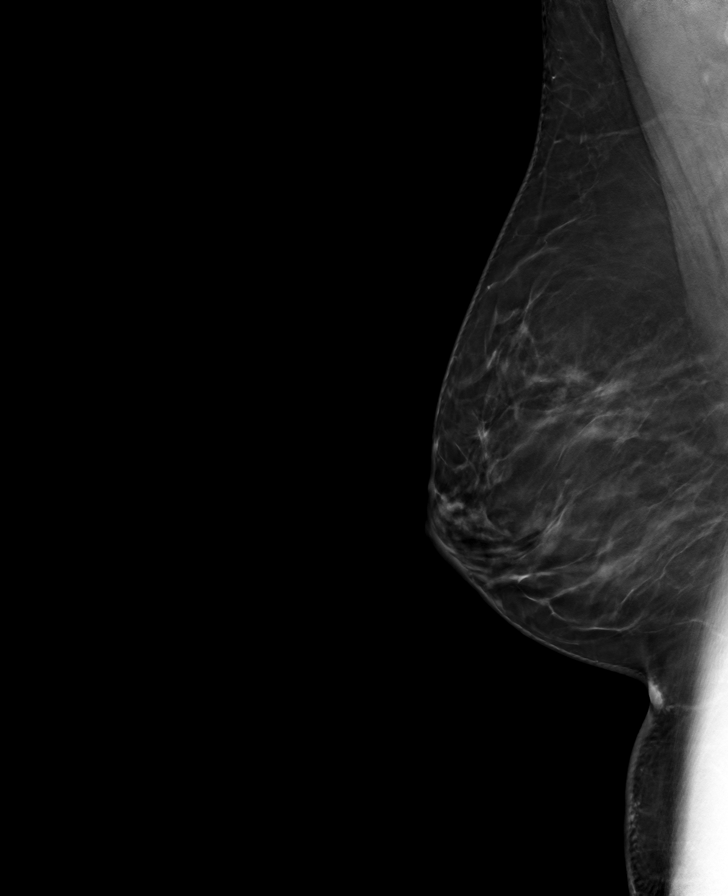

[L MLO tomo · tomo slice 41/80.0]
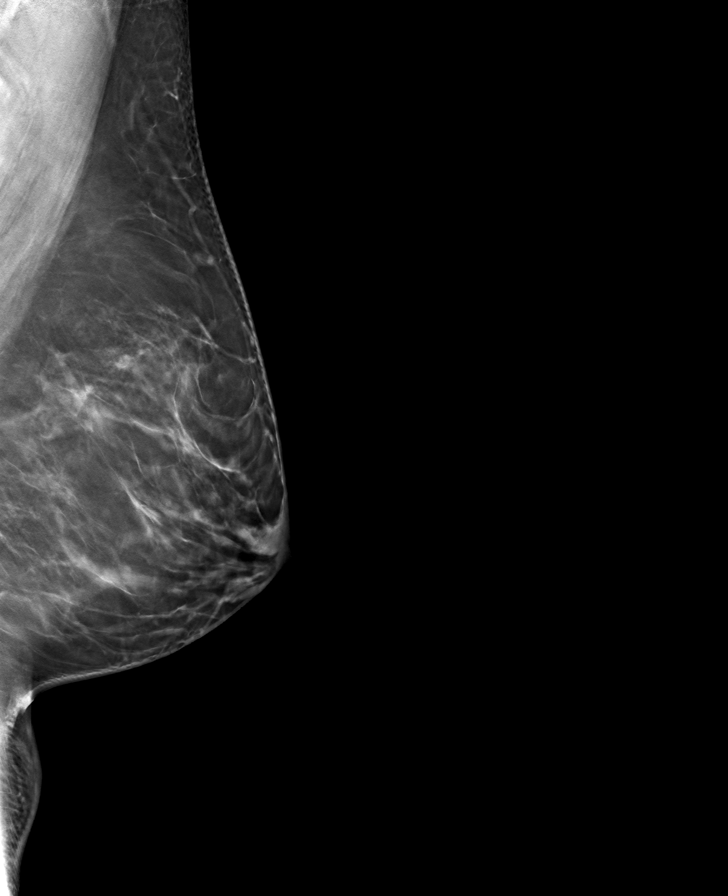

[L CC tomo · tomo slice 41/81.0]
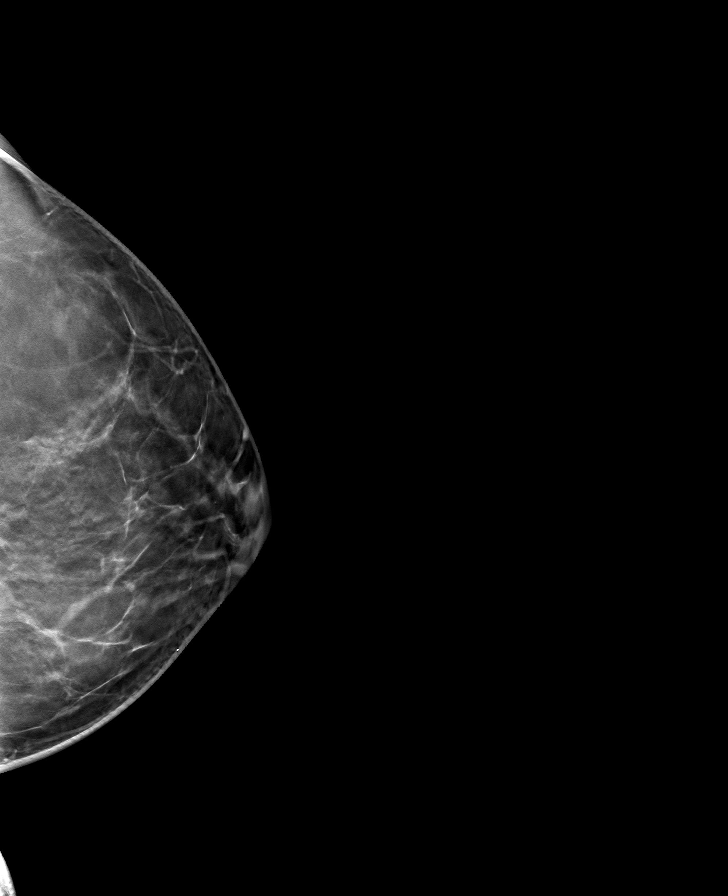

[8 of 24 positions shown; findings below may reference images not displayed]

ACR Breast Density Category b: There are scattered areas of
fibroglandular density.
FINDINGS: In the right breast a possible mass requires further evaluation.

In the left breast an asymmetry requires further evaluation.
IMPRESSION: Further evaluation is suggested for a possible mass in the right
breast.

Further evaluation is suggested for an asymmetry in the left breast.

RECOMMENDATION:
Diagnostic mammogram and possibly ultrasound of both breasts.
(Code:[id number])

The patient will be contacted regarding the findings, and additional
imaging will be scheduled.

BI-RADS CATEGORY  0: Incomplete. Need additional imaging evaluation
and/or prior mammograms for comparison.
